# Patient Record
Sex: Male | Born: 2000 | Race: White | Hispanic: No | Marital: Single | Smoking: Never smoker
Health system: Southern US, Community
[De-identification: ages and names within clinical notes are randomized; demographics above are authoritative.]

## PROBLEM LIST (undated history)

## (undated) HISTORY — PX: ADENOIDECTOMY: SUR15

---
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
## 2023-04-18 ENCOUNTER — Telehealth: Payer: Self-pay | Admitting: Pediatrics

## 2023-04-18 NOTE — Telephone Encounter (Signed)
Ok to schedule with me. Thank you.  

## 2023-04-18 NOTE — Telephone Encounter (Signed)
Patient called in and wanted to know if Dr. Reece Agar would take on her son. She stated that her husband Danny Bates sees Dr. Reece Agar and would love for him to see him as well. Please advise. Thank you!

## 2023-04-21 NOTE — Telephone Encounter (Signed)
Spoke to pt's mom, scheduled ov for 05/06/23

## 2023-05-06 ENCOUNTER — Ambulatory Visit: Payer: BC Managed Care – PPO | Admitting: Family Medicine

## 2023-05-14 ENCOUNTER — Encounter: Payer: Self-pay | Admitting: Family Medicine

## 2023-05-14 ENCOUNTER — Ambulatory Visit (INDEPENDENT_AMBULATORY_CARE_PROVIDER_SITE_OTHER): Payer: BC Managed Care – PPO | Admitting: Family Medicine

## 2023-05-14 VITALS — BP 118/78 | HR 117 | Temp 97.7°F | Ht 76.0 in | Wt 306.2 lb

## 2023-05-14 DIAGNOSIS — F4323 Adjustment disorder with mixed anxiety and depressed mood: Secondary | ICD-10-CM | POA: Diagnosis not present

## 2023-05-14 DIAGNOSIS — Z23 Encounter for immunization: Secondary | ICD-10-CM | POA: Diagnosis not present

## 2023-05-14 DIAGNOSIS — E66812 Obesity, class 2: Secondary | ICD-10-CM | POA: Diagnosis not present

## 2023-05-14 DIAGNOSIS — G479 Sleep disorder, unspecified: Secondary | ICD-10-CM | POA: Diagnosis not present

## 2023-05-14 DIAGNOSIS — Z789 Other specified health status: Secondary | ICD-10-CM | POA: Insufficient documentation

## 2023-05-14 DIAGNOSIS — R000 Tachycardia, unspecified: Secondary | ICD-10-CM | POA: Diagnosis not present

## 2023-05-14 HISTORY — DX: Adjustment disorder with mixed anxiety and depressed mood: F43.23

## 2023-05-14 NOTE — Assessment & Plan Note (Signed)
Encourage healthy diet and lifestyle choices to affect sustainable weight loss.  

## 2023-05-14 NOTE — Progress Notes (Signed)
Ph: [phone] Fax: [phone]   Patient ID: Danny Bates, male    DOB: [birth year], 22 y.o.   MRN: [id number]  This visit was conducted in person.  BP 118/78   Pulse (!) 117   Temp 97.7 F (36.5 C) (Oral)   Ht 6\' 4"  (1.93 m)   Wt (!) 306 lb 4 oz (138.9 kg)   SpO2 97%   BMI 37.28 kg/m    CC: new patient to establish care  Subjective:   HPI: Danny Bates is a 22 y.o. male presenting on 05/14/2023 for New Patient (Initial Visit)   Previously saw Dr Matthew Saras pediatrician then PCP in [place].  I see his father Danny Bates.  Lives in [place].   Erratic sleep schedule - talks with friends in the [employer] stationed in [place] - late night talks. No trouble falling asleep. Averages 6-8 hours of sleep.   H/o depression/anxiety remotely treated with antidepressant (unsure which) but felt it caused apathy. Working on healthy stress relieving strategies such as meditation. Has seen psychiatrist remotely. Never SI/HI.  Reviewing chart, last saw psychiatry 2021 Dr Delorse Lek for adjusment disorder with anxiety/depression, previously treated with mirtazapine 30mg  nightly.  Notes recurring/ongoing trouble with anxiety - worse in crowds.  Fmhx bipolar - he denies manic or hypomanic episodes.   Noted tachycardia.   Requests all labs through [facility].   Smoker - vaping a few times a day, MJ a few times per month - has limited.  Alcohol - 2 beers/day or hard cider   Significant weight gain noted over the past 2 yrs.  No known fmhx thyroid disease.   Not currently sexually active.  No significant caffeine intake   L handed  Lives with parents  Edu: [employer] in [place]  Occ: unemployed  Activity: no regular exercise  Diet: largely home cooked meals, mainly water, some juice, limited sodas, no milk      Relevant past medical, surgical, family and social history reviewed and updated as indicated. Interim medical history since our last visit reviewed. Allergies and  medications reviewed and updated. Outpatient Medications Prior to Visit  Medication Sig Dispense Refill   silver sulfADIAZINE (SILVADENE) 1 % cream Apply to affected area daily 20 g 1   No facility-administered medications prior to visit.     Per HPI unless specifically indicated in ROS section below Review of Systems  Objective:  BP 118/78   Pulse (!) 117   Temp 97.7 F (36.5 C) (Oral)   Ht 6\' 4"  (1.93 m)   Wt (!) 306 lb 4 oz (138.9 kg)   SpO2 97%   BMI 37.28 kg/m   Wt Readings from Last 3 Encounters:  05/14/23 (!) 306 lb 4 oz (138.9 kg)  02/25/16 195 lb (88.5 kg) (97%, Z= 1.90)*  11/13/15 187 lb (84.8 kg) (96%, Z= 1.80)*   * Growth percentiles are based on CDC (Boys, 2-20 Years) data.      Physical Exam Vitals and nursing note reviewed.  Constitutional:      Appearance: Normal appearance. He is not ill-appearing.  HENT:     Head: Normocephalic and atraumatic.     Right Ear: Tympanic membrane, ear canal and external ear normal.     Left Ear: Tympanic membrane, ear canal and external ear normal.     Mouth/Throat:     Mouth: Mucous membranes are moist.     Pharynx: Oropharynx is clear. No oropharyngeal exudate or posterior oropharyngeal erythema.  Eyes:  Extraocular Movements: Extraocular movements intact.     Pupils: Pupils are equal, round, and reactive to light.  Neck:     Thyroid: No thyroid mass or thyromegaly.  Cardiovascular:     Rate and Rhythm: Regular rhythm. Tachycardia present.     Pulses: Normal pulses.     Heart sounds: Normal heart sounds. No murmur heard. Pulmonary:     Effort: Pulmonary effort is normal. No respiratory distress.     Breath sounds: Normal breath sounds. No wheezing, rhonchi or rales.  Musculoskeletal:     Cervical back: Normal range of motion and neck supple. No rigidity.     Right lower leg: No edema.     Left lower leg: No edema.  Lymphadenopathy:     Cervical: No cervical adenopathy.  Skin:    General: Skin is warm and  dry.     Findings: No rash.  Neurological:     Mental Status: He is alert.  Psychiatric:        Mood and Affect: Mood normal.        Behavior: Behavior normal.       No results found for this or any previous visit.    05/14/2023   11:47 AM  Depression screen PHQ 2/9  Decreased Interest 1  Down, Depressed, Hopeless 1  PHQ - 2 Score 2  Altered sleeping 3  Tired, decreased energy 0  Change in appetite 0  Feeling bad or failure about yourself  1  Trouble concentrating 0  Moving slowly or fidgety/restless 0  Suicidal thoughts 0  PHQ-9 Score 6  Difficult doing work/chores Somewhat difficult       05/14/2023   11:47 AM  GAD 7 : Generalized Anxiety Score  Nervous, Anxious, on Edge 1  Worry too much - different things 1  Trouble relaxing 1  Restless 0  Easily annoyed or irritable 1  Afraid - awful might happen 0  Anxiety Difficulty Somewhat difficult   Assessment & Plan:   Problem List Items Addressed This Visit     Obesity, Class II, BMI 35-39.9, no comorbidity    Encourage healthy diet and lifestyle choices to affect sustainable weight loss.       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 Will not start medication at this time. Consider counseling. Defer for now given endorsed financial concerns.       Tachycardia    Will monitor for now. Check CBC, TSH when he returns for physical.       Sleep disorder    Predominant issue with erratic sleep schedule.  Encouraged set regular bedtime, provided sleep hygiene checklist.  No endorsed difficulty with insomnia.       Alcohol use    Regular alcohol use - 2 drinks/day. Discussed caloric content in alcohol. Encouraged slowly cutting down for overall health and sleep.       Other Visit Diagnoses     Encounter for immunization       Relevant Orders   Flu vaccine trivalent PF, 6mos and  older(Flulaval,Afluria,Fluarix,Fluzone) (Completed)        No orders of the defined types were placed in this encounter.   Orders Placed This Encounter  Procedures   Flu vaccine trivalent PF, 6mos and older(Flulaval,Afluria,Fluarix,Fluzone)    Patient Instructions  Flu shot today  Try to have a set hour for bedtime. Look at below checklist for sleep habits.  Cut down on alcohol -  1 drink daily for 2 weeks then 3 days a week for 2 weeks then stop.  Incorporate exercise into routine.  Return for physical at your convenience, prior fasting (4 hours) for labs.   Bedtime routine checklist: 1. Avoid naps during the day 2. Avoid stimulants such as caffeine and nicotine. Avoid bedtime alcohol (it can speed onset of sleep but the body's metabolism can cause awakenings). 3. All forms of exercise help ensure sound sleep - limit vigorous exercise to morning or late afternoon 4. Avoid food too close to bedtime including chocolate (which contains caffeine) 5. Soak up natural light 6. Establish regular bedtime routine. 7. Associate bed with sleep - avoid TV, computer or phone, reading while in bed. 8. Ensure pleasant, relaxing sleep environment - quiet, dark, cool room.   Follow up plan: Return in about 3 months (around 08/12/2023) for annual exam, prior fasting for blood work.  Eustaquio Boyden, MD

## 2023-05-14 NOTE — Assessment & Plan Note (Signed)
Discussed importance of healthy stress relieving strategies. Will not start medication at this time. Consider counseling. Defer for now given endorsed financial concerns.

## 2023-05-14 NOTE — Patient Instructions (Addendum)
Flu shot today  Try to have a set hour for bedtime. Look at below checklist for sleep habits.  Cut down on alcohol - 1 drink daily for 2 weeks then 3 days a week for 2 weeks then stop.  Incorporate exercise into routine.  Return for physical at your convenience, prior fasting (4 hours) for labs.   Bedtime routine checklist: 1. Avoid naps during the day 2. Avoid stimulants such as caffeine and nicotine. Avoid bedtime alcohol (it can speed onset of sleep but the body's metabolism can cause awakenings). 3. All forms of exercise help ensure sound sleep - limit vigorous exercise to morning or late afternoon 4. Avoid food too close to bedtime including chocolate (which contains caffeine) 5. Soak up natural light 6. Establish regular bedtime routine. 7. Associate bed with sleep - avoid TV, computer or phone, reading while in bed. 8. Ensure pleasant, relaxing sleep environment - quiet, dark, cool room.

## 2023-05-14 NOTE — Assessment & Plan Note (Signed)
Regular alcohol use - 2 drinks/day. Discussed caloric content in alcohol. Encouraged slowly cutting down for overall health and sleep.

## 2023-05-14 NOTE — Assessment & Plan Note (Signed)
Will monitor for now. Check CBC, TSH when he returns for physical.

## 2023-05-14 NOTE — Assessment & Plan Note (Signed)
Predominant issue with erratic sleep schedule.  Encouraged set regular bedtime, provided sleep hygiene checklist.  No endorsed difficulty with insomnia.

## 2023-08-05 ENCOUNTER — Other Ambulatory Visit: Payer: Self-pay | Admitting: Family Medicine

## 2023-08-05 DIAGNOSIS — R000 Tachycardia, unspecified: Secondary | ICD-10-CM

## 2023-08-05 DIAGNOSIS — E66812 Obesity, class 2: Secondary | ICD-10-CM

## 2023-08-08 ENCOUNTER — Other Ambulatory Visit: Payer: BC Managed Care – PPO

## 2023-08-08 NOTE — Addendum Note (Signed)
 Addended by: Vincenza Hews on: 08/08/2023 09:11 AM   Modules accepted: Orders

## 2023-08-11 ENCOUNTER — Other Ambulatory Visit (INDEPENDENT_AMBULATORY_CARE_PROVIDER_SITE_OTHER): Payer: BC Managed Care – PPO

## 2023-08-11 DIAGNOSIS — E66812 Obesity, class 2: Secondary | ICD-10-CM

## 2023-08-11 DIAGNOSIS — R000 Tachycardia, unspecified: Secondary | ICD-10-CM

## 2023-08-12 LAB — COMPREHENSIVE METABOLIC PANEL
ALT: 253 IU/L — ABNORMAL HIGH (ref 0–44)
AST: 115 IU/L — ABNORMAL HIGH (ref 0–40)
Albumin: 5 g/dL (ref 4.3–5.2)
Alkaline Phosphatase: 93 IU/L (ref 44–121)
BUN/Creatinine Ratio: 8 — ABNORMAL LOW (ref 9–20)
BUN: 7 mg/dL (ref 6–20)
Bilirubin Total: 2.4 mg/dL — ABNORMAL HIGH (ref 0.0–1.2)
CO2: 23 mmol/L (ref 20–29)
Calcium: 10.4 mg/dL — ABNORMAL HIGH (ref 8.7–10.2)
Chloride: 101 mmol/L (ref 96–106)
Creatinine, Ser: 0.92 mg/dL (ref 0.76–1.27)
Globulin, Total: 3 g/dL (ref 1.5–4.5)
Glucose: 94 mg/dL (ref 70–99)
Potassium: 4.7 mmol/L (ref 3.5–5.2)
Sodium: 138 mmol/L (ref 134–144)
Total Protein: 8 g/dL (ref 6.0–8.5)
eGFR: 120 mL/min/{1.73_m2} (ref >59)

## 2023-08-12 LAB — CBC WITH DIFFERENTIAL/PLATELET
Basophils Absolute: 0.1 10*3/uL (ref 0.0–0.2)
Basos: 1 %
EOS (ABSOLUTE): 0.2 10*3/uL (ref 0.0–0.4)
Eos: 2 %
Hematocrit: 52.9 % — ABNORMAL HIGH (ref 37.5–51.0)
Hemoglobin: 18.3 g/dL — ABNORMAL HIGH (ref 13.0–17.7)
Immature Grans (Abs): 0 10*3/uL (ref 0.0–0.1)
Immature Granulocytes: 1 %
Lymphocytes Absolute: 2 10*3/uL (ref 0.7–3.1)
Lymphs: 28 %
MCH: 34.1 pg — ABNORMAL HIGH (ref 26.6–33.0)
MCHC: 34.6 g/dL (ref 31.5–35.7)
MCV: 99 fL — ABNORMAL HIGH (ref 79–97)
Monocytes Absolute: 0.8 10*3/uL (ref 0.1–0.9)
Monocytes: 11 %
Neutrophils Absolute: 4.2 10*3/uL (ref 1.4–7.0)
Neutrophils: 57 %
Platelets: 273 10*3/uL (ref 150–450)
RBC: 5.37 x10E6/uL (ref 4.14–5.80)
RDW: 13 % (ref 11.6–15.4)
WBC: 7.3 10*3/uL (ref 3.4–10.8)

## 2023-08-12 LAB — LIPID PANEL
Chol/HDL Ratio: 3.9 ratio (ref 0.0–5.0)
Cholesterol, Total: 191 mg/dL (ref 100–199)
HDL: 49 mg/dL (ref >39)
LDL Chol Calc (NIH): 117 mg/dL — ABNORMAL HIGH (ref 0–99)
Triglycerides: 143 mg/dL (ref 0–149)
VLDL Cholesterol Cal: 25 mg/dL (ref 5–40)

## 2023-08-12 LAB — TSH: TSH: 2.13 u[IU]/mL (ref 0.450–4.500)

## 2023-08-15 ENCOUNTER — Ambulatory Visit (INDEPENDENT_AMBULATORY_CARE_PROVIDER_SITE_OTHER): Payer: BC Managed Care – PPO | Admitting: Family Medicine

## 2023-08-15 ENCOUNTER — Encounter: Payer: Self-pay | Admitting: Family Medicine

## 2023-08-15 VITALS — BP 128/80 | HR 102 | Temp 97.8°F | Ht 76.25 in | Wt 297.1 lb

## 2023-08-15 DIAGNOSIS — Z789 Other specified health status: Secondary | ICD-10-CM

## 2023-08-15 DIAGNOSIS — F4323 Adjustment disorder with mixed anxiety and depressed mood: Secondary | ICD-10-CM

## 2023-08-15 DIAGNOSIS — Z0000 Encounter for general adult medical examination without abnormal findings: Secondary | ICD-10-CM

## 2023-08-15 DIAGNOSIS — D751 Secondary polycythemia: Secondary | ICD-10-CM

## 2023-08-15 DIAGNOSIS — E66812 Obesity, class 2: Secondary | ICD-10-CM

## 2023-08-15 DIAGNOSIS — F909 Attention-deficit hyperactivity disorder, unspecified type: Secondary | ICD-10-CM

## 2023-08-15 DIAGNOSIS — Z23 Encounter for immunization: Secondary | ICD-10-CM

## 2023-08-15 DIAGNOSIS — G479 Sleep disorder, unspecified: Secondary | ICD-10-CM

## 2023-08-15 DIAGNOSIS — R7401 Elevation of levels of liver transaminase levels: Secondary | ICD-10-CM

## 2023-08-15 MED ORDER — ESCITALOPRAM OXALATE 5 MG PO TABS: 5.0000 mg | ORAL_TABLET | Freq: Every day | ORAL | 6 refills | Status: AC

## 2023-08-15 MED ORDER — MULTIVITAMIN ADULT PO TABS: 1.0000 | ORAL_TABLET | ORAL | Status: AC

## 2023-08-15 NOTE — Progress Notes (Signed)
 Ph: [phone] Fax: [phone]   Patient ID: Danny Bates, male    DOB: 2000-11-16, 23 y.o.   MRN: [id number]  This visit was conducted in person.  BP 128/80   Pulse (!) 102   Temp 97.8 F (36.6 C) (Oral)   Ht 6' 4.25" (1.937 m)   Wt 297 lb 2 oz (134.8 kg)   SpO2 97%   BMI 35.93 kg/m    CC: CPE Subjective:   HPI: Danny Bates is a 23 y.o. male presenting on 08/15/2023 for Annual Exam   On recent labs found to have elevated hemoglobin (18.3), elevated calcium levels (10.4), transaminitis (TC 2.4, AST 115, ALT 253).  He does note he had possible GI illness for 5 days last week (at time of blood draw): Recent malaise, epigastric abd discomfort, nausea/vomiting, diarrhea, felt feverish. No blood in stool, dysuria, hematuria. Symptoms are largely resolved.   H/o ADHD, adjustment disorder previously saw psychiatrist.  No known personal history of bipolar disorder.  Ongoing mood trouble - apathy, lack of motivation. No SI/HI.  Sleeping better - new bedtime is midnight - sleeping 8-9 hours nightly.  Mood is overall better since implementing healthy  changes to sleep routine, diet.  Wakes up feeling rested. Daytime energy is ok. No morning headaches. He does snore. No witnessed apnea or PNDyspnea.  ESS = 3  Previously on remeron, sertraline, strattera, wellbutrin.  Has had trouble with mood medications causing apathy.   Fmhx bipolar - father.  Walking for exercise.   Preventative: Colon cancer screening - not due Prostate cancer screening - not due Lung cancer screening - not due Flu shot - yearly COVID shot - did not receive  Tdap 11/2011, update today  Pneumonia shot - not due Shingrix - not due Advanced directive discussion - not discussed Seat belt use discussed Sunscreen use discussed. No changing moles on skin. Smoker - vaping a few times a day, no tobacco.  MJ - has completely stopped.  Alcohol - 2 beers/day or hard cider - has cut back as well Dentist -  q6 mo- cavity s/p filling. Brushes teeth and uses water floss regularly.  Eye exam - has not seen  Not currently sexually active.  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 Interim medical history since our last visit reviewed. Allergies and medications reviewed and updated. No outpatient medications prior to visit.   No facility-administered medications prior to visit.     Per HPI unless specifically indicated in ROS section below Review of Systems  Constitutional:  Positive for fever (feverish recently). Negative for activity change, appetite change, chills, fatigue and unexpected weight change.  HENT:  Negative for hearing loss.   Eyes:  Negative for visual disturbance.  Respiratory:  Negative for cough, chest tightness, shortness of breath and wheezing.   Cardiovascular:  Positive for palpitations (occ). Negative for chest pain and leg swelling.  Gastrointestinal:  Positive for abdominal pain (mild epigastric pain after recent GI illnelss), diarrhea, nausea and vomiting (recent GI illness). Negative for abdominal distention, blood in stool and constipation.  Genitourinary:  Negative for difficulty urinating and hematuria.  Musculoskeletal:  Negative for  arthralgias, myalgias and neck pain.  Skin:  Negative for rash.  Neurological:  Negative for dizziness, seizures, syncope and headaches.  Hematological:  Negative for adenopathy. Does not bruise/bleed easily.  Psychiatric/Behavioral:  Positive for dysphoric mood. Negative for suicidal ideas. The patient is nervous/anxious.     Objective:  BP 128/80   Pulse (!) 102   Temp 97.8 F (36.6 C) (Oral)   Ht 6'  4.25" (1.937 m)   Wt 297 lb 2 oz (134.8 kg)   SpO2 97%   BMI 35.93 kg/m   Wt Readings from Last 3 Encounters:  08/15/23 297 lb 2 oz (134.8 kg)  05/14/23 (!) 306 lb 4 oz (138.9 kg)  02/25/16 195 lb (88.5 kg) (97%, Z= 1.90)*   * Growth percentiles are based on CDC (Boys, 2-20 Years) data.      Physical Exam Vitals and nursing note reviewed.  Constitutional:      General: He is not in acute distress.    Appearance: Normal appearance. He is well-developed. He is not ill-appearing.  HENT:     Head: Normocephalic and atraumatic.     Right Ear: Hearing, tympanic membrane, ear canal and external ear normal.     Left Ear: Hearing, tympanic membrane, ear canal and external ear normal.     Mouth/Throat:     Mouth: Mucous membranes are moist.     Pharynx: Oropharynx is clear. No oropharyngeal exudate or posterior oropharyngeal erythema.  Eyes:     General: No scleral icterus.    Extraocular Movements: Extraocular movements intact.     Conjunctiva/sclera: Conjunctivae normal.     Pupils: Pupils are equal, round, and reactive to light.  Neck:     Thyroid: No thyroid mass or thyromegaly.  Cardiovascular:     Rate and Rhythm: Normal rate and regular rhythm.     Pulses: Normal pulses.          Radial pulses are 2+ on the right side and 2+ on the left side.     Heart sounds: Normal heart sounds. No murmur heard. Pulmonary:     Effort: Pulmonary effort is normal. No respiratory distress.     Breath sounds: Normal breath sounds. No wheezing, rhonchi or rales.  Abdominal:     General: Bowel sounds are normal. There is no distension.     Palpations: Abdomen is soft. There is no mass.     Tenderness: There is no abdominal tenderness. There is no guarding or rebound.     Hernia: No hernia is present.  Musculoskeletal:        General: Normal range of motion.     Cervical back: Normal range of motion and neck supple.     Right lower leg: No edema.     Left lower leg: No edema.   Lymphadenopathy:     Cervical: No cervical adenopathy.  Skin:    General: Skin is warm and dry.     Findings: No rash.  Neurological:     General: No focal deficit present.     Mental Status: He is alert and oriented to person, place, and time.  Psychiatric:        Mood and Affect: Mood normal.        Behavior: Behavior normal.        Thought Content: Thought content normal.        Judgment: Judgment normal.       Results for orders placed or performed in visit on 08/11/23  Lipid panel  Collection Time: 08/11/23 10:54 AM  Result Value Ref Range   Cholesterol, Total 191 100 - 199 mg/dL   Triglycerides 454 0 - 149 mg/dL   HDL 49 >09 mg/dL   VLDL Cholesterol Cal 25 5 - 40 mg/dL   LDL Chol Calc (NIH) 811 (H) 0 - 99 mg/dL   Chol/HDL Ratio 3.9 0.0 - 5.0 ratio  Comprehensive metabolic panel   Collection Time: 08/11/23 10:54 AM  Result Value Ref Range   Glucose 94 70 - 99 mg/dL   BUN 7 6 - 20 mg/dL   Creatinine, Ser 9.14 0.76 - 1.27 mg/dL   eGFR 782 >95 AO/ZHY/8.65   BUN/Creatinine Ratio 8 (L) 9 - 20   Sodium 138 134 - 144 mmol/L   Potassium 4.7 3.5 - 5.2 mmol/L   Chloride 101 96 - 106 mmol/L   CO2 23 20 - 29 mmol/L   Calcium 10.4 (H) 8.7 - 10.2 mg/dL   Total Protein 8.0 6.0 - 8.5 g/dL   Albumin 5.0 4.3 - 5.2 g/dL   Globulin, Total 3.0 1.5 - 4.5 g/dL   Bilirubin Total 2.4 (H) 0.0 - 1.2 mg/dL   Alkaline Phosphatase 93 44 - 121 IU/L   AST 115 (H) 0 - 40 IU/L   ALT 253 (H) 0 - 44 IU/L  TSH   Collection Time: 08/11/23 10:54 AM  Result Value Ref Range   TSH 2.130 0.450 - 4.500 uIU/mL  CBC with Differential/Platelet   Collection Time: 08/11/23 10:54 AM  Result Value Ref Range   WBC 7.3 3.4 - 10.8 x10E3/uL   RBC 5.37 4.14 - 5.80 x10E6/uL   Hemoglobin 18.3 (H) 13.0 - 17.7 g/dL   Hematocrit 78.4 (H) 69.6 - 51.0 %   MCV 99 (H) 79 - 97 fL   MCH 34.1 (H) 26.6 - 33.0 pg   MCHC 34.6 31.5 - 35.7 g/dL   RDW 29.5 28.4 - 13.2 %   Platelets 273 150 - 450 x10E3/uL   Neutrophils  57 Not Estab. %   Lymphs 28 Not Estab. %   Monocytes 11 Not Estab. %   Eos 2 Not Estab. %   Basos 1 Not Estab. %   Neutrophils Absolute 4.2 1.4 - 7.0 x10E3/uL   Lymphocytes Absolute 2.0 0.7 - 3.1 x10E3/uL   Monocytes Absolute 0.8 0.1 - 0.9 x10E3/uL   EOS (ABSOLUTE) 0.2 0.0 - 0.4 x10E3/uL   Basophils Absolute 0.1 0.0 - 0.2 x10E3/uL   Immature Granulocytes 1 Not Estab. %   Immature Grans (Abs) 0.0 0.0 - 0.1 x10E3/uL   Depression:     08/15/2023    4:32 PM 05/14/2023   11:47 AM  Depression screen PHQ 2/9  Decreased Interest 1 1  Down, Depressed, Hopeless 1 1  PHQ - 2 Score 2 2  Altered sleeping 0 3  Tired, decreased energy 1 0  Change in appetite 1 0  Feeling bad or failure about yourself  1 1  Trouble concentrating 0 0  Moving slowly or fidgety/restless 0 0  Suicidal thoughts 0 0  PHQ-9 Score 5 6  Difficult doing work/chores Somewhat difficult Somewhat difficult    Anxiety:     08/15/2023    4:32 PM 05/14/2023   11:47 AM  GAD 7 : Generalized Anxiety Score  Nervous, Anxious, on Edge 1 1  Control/stop worrying 1   Worry too much - different things 1 1  Trouble relaxing 1 1  Restless 0 0  Easily annoyed or irritable 1 1  Afraid -  awful might happen 0 0  Total GAD 7 Score 5   Anxiety Difficulty Somewhat difficult Somewhat difficult   MDQ7+1:  Has there ever been a period of time when you were not your usual self and:  You felt so good or hyper that other people thought you were not your normal self or you got into trouble? Y You were so irritable that you shouted at people or started fights or arguments? N You felt much more self-confident than usual? Y You got much less sleep than usual and found you didn't really miss it? Y Thoughts raced through your head or you couldn't slow your mind down? Y You were so easily distracted by things around you that you had trouble concentrating or staying on track? N You had much more energy than usual? Y You were much more active or  did many more things than usual? Y You were much more social or outgoing than usual, for example you telephoned friends in the middle of the night? N You were much more interested in sex than usual? Y You did things that were unusual for you or that other ppl might have thought excessive, foolish, risky? N Spending money got you or your family into trouble? N  If YES to more than one, have several of these ever happened during the same period? Y  How much of a problem did any of these cause you - unable to work, having family, money, legal troubles, getting into arguments or fights? No prob  Minor prob Moderate prob   Serious prob   Have any blood relative had manic-depressive illness or bipolar disorder? Y Has a health professional ever told you that you have manic-depression illness or bipolar disorder? N   Assessment & Plan:   Problem List Items Addressed This Visit     Health maintenance examination - Primary (Chronic)   Preventative protocols reviewed and updated unless pt declined. Discussed healthy diet and lifestyle.       Obesity, Class II, BMI 35-39.9, no comorbidity   Congratulated on 9 lb weight loss over the past 3 months!  He has implemented healthy diet and lifestyle changes - continue this.       Adjustment disorder with mixed anxiety and depressed mood   Notes improvement since implementing healthy lifestyle and diet changes - especially more consistent sleep schedule.  Notes ongoing mood difficulty  Screens borderline positive for MDQ - however he notes several positive readings are while using alcohol - anticipate false positive. He does have fmhx bipolar. Discussed options of psychiatry evaluation r/o bipolar vs trial of antidepressant alone, reviewing risk of unmasking manic episode if truly bipolar diagnosis. He understands this risk and prefers to trial antidepressant medication - has previously taken and tolerated well. Reviewed increased risk of suicidality  with antidepressant use.  Start lexapro 5mg  daily, reviewing common side effects.  RTC 3 mo mood f/u visit.       Sleep disorder   Notes improvement in sleep habits with earlier more regular bedtime and averaging 8+ hours/night - congratulated on healthy change! ESS = 3 - low risk for OSA. Consider HST if further evaluation merited.       Alcohol use   Has significantly cut down on alcohol intake - congratulated.  Has also stopped MJ use. Continues vaping.       ADHD   H/o this, no recent treatment.       Transaminitis   In setting of recent GI illness suspect  viral gastroenteritis now largely resolved.  He will return in 3 wks to repeat LFTs - if abnormal will proceed with further evaluation.       Relevant Orders   Comprehensive metabolic panel   Polycythemia   In setting of recent GI illness.  Discussed ddx including vaping related, OSA, recent illness.  Recheck at 3 wk f/u labs       Relevant Orders   CBC with Differential/Platelet   Other Visit Diagnoses       Need for Tdap vaccination       Relevant Orders   Tdap vaccine greater than or equal to 7yo IM (Completed)        Meds ordered this encounter  Medications   Multiple Vitamin (MULTIVITAMIN ADULT) TABS    Sig: Take 1 tablet by mouth 2 (two) times a week.   escitalopram (LEXAPRO) 5 MG tablet    Sig: Take 1 tablet (5 mg total) by mouth daily.    Dispense:  30 tablet    Refill:  6    Orders Placed This Encounter  Procedures   Tdap vaccine greater than or equal to 7yo IM   Comprehensive metabolic panel    Standing Status:   Future    Expiration Date:   08/15/2024   CBC with Differential/Platelet    Standing Status:   Future    Expiration Date:   08/15/2024    Patient Instructions  Tdap today  Trial lexapro 5mg  daily for mood.  Return in 3 weeks for repeat labs (liver, blood counts, calcium).  Let me know if recurrent abdominal pain or nausea/vomiting.  Good to see you today Return as needed or  in 3 months for mood follow up  Follow up plan: Return in about 3 months (around 11/15/2023) for follow up visit.  Eustaquio Boyden, MD

## 2023-08-15 NOTE — Patient Instructions (Addendum)
 Tdap today  Trial lexapro 5mg  daily for mood.  Return in 3 weeks for repeat labs (liver, blood counts, calcium).  Let me know if recurrent abdominal pain or nausea/vomiting.  Good to see you today Return as needed or in 3 months for mood follow up

## 2023-08-16 ENCOUNTER — Encounter: Payer: Self-pay | Admitting: Family Medicine

## 2023-08-16 DIAGNOSIS — Z0000 Encounter for general adult medical examination without abnormal findings: Secondary | ICD-10-CM | POA: Insufficient documentation

## 2023-08-16 DIAGNOSIS — F909 Attention-deficit hyperactivity disorder, unspecified type: Secondary | ICD-10-CM | POA: Insufficient documentation

## 2023-08-16 DIAGNOSIS — R7401 Elevation of levels of liver transaminase levels: Secondary | ICD-10-CM | POA: Insufficient documentation

## 2023-08-16 DIAGNOSIS — D751 Secondary polycythemia: Secondary | ICD-10-CM | POA: Insufficient documentation

## 2023-08-16 NOTE — Assessment & Plan Note (Signed)
 Notes improvement since implementing healthy lifestyle and diet changes - especially more consistent sleep schedule.  Notes ongoing mood difficulty  Screens borderline positive for MDQ - however he notes several positive readings are while using alcohol - anticipate false positive. He does have fmhx bipolar. Discussed options of psychiatry evaluation r/o bipolar vs trial of antidepressant alone, reviewing risk of unmasking manic episode if truly bipolar diagnosis. He understands this risk and prefers to trial antidepressant medication - has previously taken and tolerated well. Reviewed increased risk of suicidality with antidepressant use.  Start lexapro 5mg  daily, reviewing common side effects.  RTC 3 mo mood f/u visit.

## 2023-08-16 NOTE — Assessment & Plan Note (Addendum)
 Has significantly cut down on alcohol intake - congratulated.  Has also stopped MJ use. Continues vaping.

## 2023-08-16 NOTE — Assessment & Plan Note (Signed)
 Congratulated on 9 lb weight loss over the past 3 months!  He has implemented healthy diet and lifestyle changes - continue this.

## 2023-08-16 NOTE — Assessment & Plan Note (Signed)
 H/o this, no recent treatment.

## 2023-08-16 NOTE — Assessment & Plan Note (Signed)
 Preventative protocols reviewed and updated unless pt declined. Discussed healthy diet and lifestyle.

## 2023-08-16 NOTE — Assessment & Plan Note (Signed)
 In setting of recent GI illness.  Discussed ddx including vaping related, OSA, recent illness.  Recheck at 3 wk f/u labs

## 2023-08-16 NOTE — Assessment & Plan Note (Signed)
 In setting of recent GI illness suspect viral gastroenteritis now largely resolved.  He will return in 3 wks to repeat LFTs - if abnormal will proceed with further evaluation.

## 2023-08-16 NOTE — Assessment & Plan Note (Addendum)
 Notes improvement in sleep habits with earlier more regular bedtime and averaging 8+ hours/night - congratulated on healthy change! ESS = 3 - low risk for OSA. Consider HST if further evaluation merited.

## 2023-09-08 ENCOUNTER — Other Ambulatory Visit (INDEPENDENT_AMBULATORY_CARE_PROVIDER_SITE_OTHER)

## 2023-09-08 DIAGNOSIS — R7401 Elevation of levels of liver transaminase levels: Secondary | ICD-10-CM | POA: Diagnosis not present

## 2023-09-08 DIAGNOSIS — D751 Secondary polycythemia: Secondary | ICD-10-CM

## 2023-09-08 NOTE — Addendum Note (Signed)
 Addended by: Alvina Chou on: 09/08/2023 10:18 AM   Modules accepted: Orders

## 2023-09-09 LAB — CBC WITH DIFFERENTIAL/PLATELET
Basophils Absolute: 0.1 10*3/uL (ref 0.0–0.2)
Basos: 1 %
EOS (ABSOLUTE): 0.2 10*3/uL (ref 0.0–0.4)
Eos: 2 %
Hematocrit: 50.4 % (ref 37.5–51.0)
Hemoglobin: 17.8 g/dL — ABNORMAL HIGH (ref 13.0–17.7)
Immature Grans (Abs): 0.1 10*3/uL (ref 0.0–0.1)
Immature Granulocytes: 1 %
Lymphocytes Absolute: 3.3 10*3/uL — ABNORMAL HIGH (ref 0.7–3.1)
Lymphs: 36 %
MCH: 33.9 pg — ABNORMAL HIGH (ref 26.6–33.0)
MCHC: 35.3 g/dL (ref 31.5–35.7)
MCV: 96 fL (ref 79–97)
Monocytes Absolute: 1 10*3/uL — ABNORMAL HIGH (ref 0.1–0.9)
Monocytes: 10 %
Neutrophils Absolute: 4.7 10*3/uL (ref 1.4–7.0)
Neutrophils: 50 %
Platelets: 251 10*3/uL (ref 150–450)
RBC: 5.25 x10E6/uL (ref 4.14–5.80)
RDW: 13 % (ref 11.6–15.4)
WBC: 9.3 10*3/uL (ref 3.4–10.8)

## 2023-09-09 LAB — COMPREHENSIVE METABOLIC PANEL WITH GFR
ALT: 224 IU/L — ABNORMAL HIGH (ref 0–44)
AST: 88 IU/L — ABNORMAL HIGH (ref 0–40)
Albumin: 5 g/dL (ref 4.3–5.2)
Alkaline Phosphatase: 91 IU/L (ref 44–121)
BUN/Creatinine Ratio: 7 — ABNORMAL LOW (ref 9–20)
BUN: 6 mg/dL (ref 6–20)
Bilirubin Total: 1.5 mg/dL — ABNORMAL HIGH (ref 0.0–1.2)
CO2: 22 mmol/L (ref 20–29)
Calcium: 10.6 mg/dL — ABNORMAL HIGH (ref 8.7–10.2)
Chloride: 100 mmol/L (ref 96–106)
Creatinine, Ser: 0.92 mg/dL (ref 0.76–1.27)
Globulin, Total: 2.5 g/dL (ref 1.5–4.5)
Glucose: 96 mg/dL (ref 70–99)
Potassium: 4.7 mmol/L (ref 3.5–5.2)
Sodium: 143 mmol/L (ref 134–144)
Total Protein: 7.5 g/dL (ref 6.0–8.5)
eGFR: 120 mL/min/{1.73_m2} (ref >59)

## 2023-09-11 ENCOUNTER — Other Ambulatory Visit: Payer: Self-pay | Admitting: Family Medicine

## 2023-09-11 DIAGNOSIS — E559 Vitamin D deficiency, unspecified: Secondary | ICD-10-CM | POA: Insufficient documentation

## 2023-09-11 LAB — SPECIMEN STATUS REPORT

## 2023-09-11 LAB — VITAMIN D 25 HYDROXY (VIT D DEFICIENCY, FRACTURES): Vit D, 25-Hydroxy: 4.7 ng/mL — ABNORMAL LOW (ref 30.0–100.0)

## 2023-09-11 MED ORDER — VITAMIN D (ERGOCALCIFEROL) 1.25 MG (50000 UNIT) PO CAPS: 50000.0000 [IU] | ORAL_CAPSULE | ORAL | 1 refills | Status: AC

## 2023-11-17 ENCOUNTER — Ambulatory Visit: Admitting: Family Medicine

## 2023-11-18 ENCOUNTER — Ambulatory Visit: Admitting: Family Medicine

## 2023-12-15 ENCOUNTER — Ambulatory Visit: Admitting: Family Medicine

## 2023-12-24 ENCOUNTER — Ambulatory Visit: Admitting: Family Medicine

## 2024-01-05 ENCOUNTER — Ambulatory Visit: Admitting: Family Medicine

## 2024-02-02 ENCOUNTER — Ambulatory Visit: Admitting: Family Medicine

## 2024-02-20 ENCOUNTER — Ambulatory Visit: Admitting: Family Medicine

## 2024-03-22 ENCOUNTER — Ambulatory Visit: Admitting: Family Medicine

## 2024-03-31 ENCOUNTER — Ambulatory Visit: Admitting: Family Medicine

## 2024-04-20 ENCOUNTER — Ambulatory Visit: Admitting: Family Medicine
